# Patient Record
Sex: Male | Born: 2000 | Race: Black or African American | Hispanic: No | Marital: Single | Smoking: Never smoker
Health system: Southern US, Community
[De-identification: ages and names within clinical notes are randomized; demographics above are authoritative.]

## PROBLEM LIST (undated history)

## (undated) DIAGNOSIS — R51 Headache: Secondary | ICD-10-CM

## (undated) DIAGNOSIS — S82892A Other fracture of left lower leg, initial encounter for closed fracture: Secondary | ICD-10-CM

## (undated) DIAGNOSIS — S8292XA Unspecified fracture of left lower leg, initial encounter for closed fracture: Secondary | ICD-10-CM

## (undated) DIAGNOSIS — S6291XA Unspecified fracture of right wrist and hand, initial encounter for closed fracture: Secondary | ICD-10-CM

## (undated) DIAGNOSIS — R519 Headache, unspecified: Secondary | ICD-10-CM

## (undated) HISTORY — DX: Unspecified fracture of right wrist and hand, initial encounter for closed fracture: S62.91XA

## (undated) HISTORY — DX: Unspecified fracture of left lower leg, initial encounter for closed fracture: S82.92XA

## (undated) HISTORY — DX: Other fracture of left lower leg, initial encounter for closed fracture: S82.892A

---
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
## 2020-04-25 ENCOUNTER — Encounter (HOSPITAL_COMMUNITY): Payer: Self-pay

## 2020-04-25 ENCOUNTER — Emergency Department (HOSPITAL_COMMUNITY)
Admission: EM | Admit: 2020-04-25 | Discharge: 2020-04-25 | Disposition: A | Discharge status: Home / Self Care | Payer: BC Managed Care – PPO | Attending: Emergency Medicine | Admitting: Emergency Medicine

## 2020-04-25 ENCOUNTER — Other Ambulatory Visit: Payer: Self-pay

## 2020-04-25 DIAGNOSIS — R112 Nausea with vomiting, unspecified: Secondary | ICD-10-CM

## 2020-04-25 MED ORDER — ONDANSETRON HCL 4 MG/2ML IJ SOLN
4.0000 mg | Freq: Once | INTRAMUSCULAR | Status: AC
  Administered 2020-04-25: 4 mg via INTRAVENOUS
  Filled 2020-04-25: qty 2

## 2020-04-25 MED ORDER — ONDANSETRON 4 MG PO TBDP: 4.0000 mg | ORAL_TABLET | Freq: Three times a day (TID) | ORAL | 0 refills | Status: AC | PRN

## 2020-04-25 MED ORDER — SODIUM CHLORIDE 0.9 % IV BOLUS
1000.0000 mL | Freq: Once | INTRAVENOUS | Status: AC
  Administered 2020-04-25: 1000 mL via INTRAVENOUS

## 2020-04-25 NOTE — ED Triage Notes (Signed)
Pt reports abdominal pain and vomiting after ingesting raw chicken from [employer] around midnight tonight.

## 2020-04-25 NOTE — ED Provider Notes (Signed)
[facility]-EMERGENCY DEPT Provider Note   CSN: [id number] Arrival date & time: 04/25/20  0507     History Chief Complaint  Patient presents with  . Vomiting    Patrick Oneal is a 19 y.o. male.  Patient presents to the emergency department with a chief complaint of nausea and vomiting.  He states that he ate some undercooked chicken last night at a fast food restaurant.  He reports nausea and vomiting that started a couple of hours ago.  He denies any focal abdominal pain.  Denies any fevers.  Denies any diarrhea.  Denies any treatment prior to arrival.  Denies any other associated symptoms.  The history is provided by the patient. No language interpreter was used.       Past Medical History:  Diagnosis Date  . Fracture of left ankle 2014  . Fracture of left lower leg 2014  . Fracture of right hand 07/2013  . Headache     There are no problems to display for this patient.   Past Surgical History:  Procedure Laterality Date  . FRACTURE SURGERY Right 07/2013   5th metacarpal       No family history on file.  Social History   Tobacco Use  . Smoking status: Never Smoker  . Smokeless tobacco: Never Used  Vaping Use  . Vaping Use: Every day  Substance Use Topics  . Alcohol use: No  . Drug use: Yes    Frequency: 3.0 times per week    Types: Marijuana    Home Medications Prior to Admission medications   Medication Sig Start Date End Date Taking? Authorizing Provider  dicyclomine (BENTYL) 20 MG tablet Take 1 tablet (20 mg total) by mouth every 8 (eight) hours as needed for spasms (Abdominal cramping). Patient taking differently: Take 20 mg by mouth every 8 (eight) hours as needed for spasms (and/or abdominal cramping).  05/16/18   Ward, Layla Maw, DO  ondansetron (ZOFRAN ODT) 4 MG disintegrating tablet Take 1 tablet (4 mg total) by mouth every 6 (six) hours as needed for nausea or vomiting. 05/16/18   Ward, Layla Maw, DO  pantoprazole  (PROTONIX) 40 MG tablet Take 40 mg by mouth daily. 05/20/18 05/20/19  [provider]    Allergies    Patient has no known allergies.  Review of Systems   Review of Systems  All other systems reviewed and are negative.   Physical Exam Updated Vital Signs BP (!) 157/83 (BP Location: Right Arm)   Pulse 60   Temp 97.8 F (36.6 C) (Oral)   Resp 18   Ht 6\' 2"  (1.88 m)   Wt 95.3 kg   SpO2 100%   BMI 26.96 kg/m   Physical Exam Vitals and nursing note reviewed.  Constitutional:      Appearance: He is well-developed and well-nourished.  HENT:     Head: Normocephalic and atraumatic.  Eyes:     Conjunctiva/sclera: Conjunctivae normal.  Cardiovascular:     Rate and Rhythm: Normal rate and regular rhythm.     Heart sounds: No murmur heard.   Pulmonary:     Effort: Pulmonary effort is normal. No respiratory distress.     Breath sounds: Normal breath sounds.  Abdominal:     Palpations: Abdomen is soft.     Tenderness: There is no abdominal tenderness.     Comments: No focal abdominal tenderness, no RLQ tenderness or pain at McBurney's point, no RUQ tenderness or Murphy's sign, no left-sided abdominal  tenderness, no fluid wave, or signs of peritonitis   Musculoskeletal:        General: No edema. Normal range of motion.     Cervical back: Neck supple.  Skin:    General: Skin is warm and dry.  Neurological:     Mental Status: He is alert and oriented to person, place, and time.  Psychiatric:        Mood and Affect: Mood and affect and mood normal.        Behavior: Behavior normal.     ED Results / Procedures / Treatments   Labs (all labs ordered are listed, but only abnormal results are displayed) Labs Reviewed - No data to display  EKG None  Radiology No results found.  Procedures Procedures (including critical care time)  Medications Ordered in ED Medications  ondansetron (ZOFRAN) injection 4 mg (has no administration in time range)  sodium chloride 0.9  % bolus 1,000 mL (has no administration in time range)    ED Course  I have reviewed the triage vital signs and the nursing notes.  Pertinent labs & imaging results that were available during my care of the patient were reviewed by me and considered in my medical decision making (see chart for details).    MDM Rules/Calculators/A&P                          Patient here with nausea and vomiting.  Likely secondary to undercooked chicken when she ate last night.  Will give fluids and Zofran.  His abdomen is soft and nontender.  He is afebrile.  I doubt surgical or acute abdomen.  Plan for discharge following fluids and Zofran. Final Clinical Impression(s) / ED Diagnoses Final diagnoses:  Non-intractable vomiting with nausea, unspecified vomiting type    Rx / DC Orders ED Discharge Orders         Ordered    ondansetron (ZOFRAN ODT) 4 MG disintegrating tablet  Every 8 hours PRN        04/25/20 0618           Roxy Horseman, PA-C 04/25/20 0618    Molpus, Jonny Ruiz, MD 04/25/20 [phone]

## 2022-04-16 ENCOUNTER — Emergency Department (HOSPITAL_COMMUNITY)
Admission: EM | Admit: 2022-04-16 | Discharge: 2022-04-16 | Disposition: A | Discharge status: Home / Self Care | Payer: BC Managed Care – PPO | Attending: Emergency Medicine | Admitting: Emergency Medicine

## 2022-04-16 ENCOUNTER — Encounter (HOSPITAL_COMMUNITY): Payer: Self-pay

## 2022-04-16 ENCOUNTER — Emergency Department (HOSPITAL_COMMUNITY): Payer: BC Managed Care – PPO

## 2022-04-16 ENCOUNTER — Other Ambulatory Visit: Payer: Self-pay

## 2022-04-16 DIAGNOSIS — W228XXA Striking against or struck by other objects, initial encounter: Secondary | ICD-10-CM | POA: Insufficient documentation

## 2022-04-16 DIAGNOSIS — S82832A Other fracture of upper and lower end of left fibula, initial encounter for closed fracture: Secondary | ICD-10-CM | POA: Diagnosis not present

## 2022-04-16 DIAGNOSIS — Y990 Civilian activity done for income or pay: Secondary | ICD-10-CM | POA: Insufficient documentation

## 2022-04-16 DIAGNOSIS — S99912A Unspecified injury of left ankle, initial encounter: Secondary | ICD-10-CM | POA: Diagnosis present

## 2022-04-16 MED ORDER — ACETAMINOPHEN 325 MG PO TABS: 650.0000 mg | ORAL_TABLET | Freq: Four times a day (QID) | ORAL | 0 refills | Status: AC | PRN

## 2022-04-16 MED ORDER — IBUPROFEN 600 MG PO TABS: 600.0000 mg | ORAL_TABLET | Freq: Four times a day (QID) | ORAL | 0 refills | Status: AC | PRN

## 2022-04-16 NOTE — Discharge Instructions (Addendum)
You should not put any weight on your left ankle.  You have a broken bone in your ankle.  Use the crutches whenever walking.  When you are at rest, at home, put your foot up on the couch or keep it elevated.  You can put ice or frozen peas for 10 minutes at a time, on and off, on your ankle to help with swelling.  Do this for the next 3 days.  You can also take ibuprofen and Tylenol together as needed for pain and swelling.

## 2022-04-16 NOTE — ED Triage Notes (Signed)
Patient reports that he had a lift under a car and moved the lift which hit his left ankle. Patient has swelling and pain.

## 2022-04-16 NOTE — ED Provider Notes (Signed)
[facility]-EMERGENCY DEPT Provider Note   CSN: [id number] Arrival date & time: 04/16/22  1727     History  Chief Complaint  Patient presents with   Ankle Injury    Patrick Oneal is a 21 y.o. male presenting to ED with an injury to his left ankle, reports he accidentally struck his left ankle with a forklift yesterday while at work.  He has been able to walk on it but it is swollen particularly around his lateral malleoli  HPI     Home Medications Prior to Admission medications   Medication Sig Start Date End Date Taking? Authorizing Provider  acetaminophen (TYLENOL) 325 MG tablet Take 2 tablets (650 mg total) by mouth every 6 (six) hours as needed for up to 30 doses for moderate pain or mild pain. 04/16/22  Yes Robey Massmann, Kermit Balo, MD  ibuprofen (ADVIL) 600 MG tablet Take 1 tablet (600 mg total) by mouth every 6 (six) hours as needed for up to 30 doses for mild pain or moderate pain. 04/16/22  Yes Saaya Procell, Kermit Balo, MD  dicyclomine (BENTYL) 20 MG tablet Take 1 tablet (20 mg total) by mouth every 8 (eight) hours as needed for spasms (Abdominal cramping). Patient taking differently: Take 20 mg by mouth every 8 (eight) hours as needed for spasms (and/or abdominal cramping).  05/16/18   Ward, Layla Maw, DO  ondansetron (ZOFRAN ODT) 4 MG disintegrating tablet Take 1 tablet (4 mg total) by mouth every 8 (eight) hours as needed for nausea or vomiting. 04/25/20   Roxy Horseman, PA-C  pantoprazole (PROTONIX) 40 MG tablet Take 40 mg by mouth daily. 05/20/18 05/20/19  [provider]      Allergies    Patient has no known allergies.    Review of Systems   Review of Systems  Physical Exam Updated Vital Signs BP (!) 145/83 (BP Location: Left Arm)   Pulse 69   Temp 98.7 F (37.1 C) (Oral)   Resp 18   Ht 6\' 2"  (1.88 m)   Wt 95.3 kg   SpO2 100%   BMI 26.96 kg/m  Physical Exam Constitutional:      General: He is not in acute distress. HENT:     Head:  Normocephalic and atraumatic.  Eyes:     Conjunctiva/sclera: Conjunctivae normal.     Pupils: Pupils are equal, round, and reactive to light.  Cardiovascular:     Rate and Rhythm: Normal rate and regular rhythm.  Pulmonary:     Effort: Pulmonary effort is normal. No respiratory distress.  Musculoskeletal:     Comments: Left ankle with dorsal swelling and mild tenderness of anterior talofibular ligament, swelling and tenderness of the lateral posterior malleoli, no tenderness of the medial malleoli or at the base of the fifth metatarsal or the midfoot.  Skin:    General: Skin is warm and dry.  Neurological:     General: No focal deficit present.     Mental Status: He is alert. Mental status is at baseline.  Psychiatric:        Mood and Affect: Mood normal.        Behavior: Behavior normal.     ED Results / Procedures / Treatments   Labs (all labs ordered are listed, but only abnormal results are displayed) Labs Reviewed - No data to display  EKG None  Radiology DG Ankle Complete Left  Result Date: 04/16/2022 CLINICAL DATA:  lateral malleolar swelling and ttp EXAM: LEFT ANKLE COMPLETE - 3+  VIEW COMPARISON:  None Available. FINDINGS: There is an oblique fracture in the distal left fibular metaphysis, nondisplaced. Overlying soft tissue swelling. No subluxation or dislocation. IMPRESSION: Oblique nondisplaced distal fibular fracture. Electronically Signed   By: Rolm Baptise M.D.   On: 04/16/2022 20:32    Procedures Procedures    Medications Ordered in ED Medications - No data to display  ED Course/ Medical Decision Making/ A&P                           Medical Decision Making Amount and/or Complexity of Data Reviewed Radiology: ordered.  Risk OTC drugs. Prescription drug management.   Patient is here with left ankle injury at work yesterday.  X-rays ordered personally viewed interpreted, showing minimally displaced distal left fibula fracture.  Patient was given  crutches and made nonweightbearing.  He was able to do this well in the ED.  Okay for follow-up with orthopedics.  A work note was provided.  All questions were answered.  He is neurovascularly intact on exam.        Final Clinical Impression(s) / ED Diagnoses Final diagnoses:  Closed fracture of distal end of left fibula, unspecified fracture morphology, initial encounter    Rx / DC Orders ED Discharge Orders          Ordered    ibuprofen (ADVIL) 600 MG tablet  Every 6 hours PRN        04/16/22 2043    acetaminophen (TYLENOL) 325 MG tablet  Every 6 hours PRN        04/16/22 2043              Wyvonnia Dusky, MD 04/16/22 2044
